# Patient Record
Sex: Male | Born: 1965 | Race: White | Hispanic: No | Marital: Married
Health system: Southern US, Community
[De-identification: ages and names within clinical notes are randomized; demographics above are authoritative.]

---

## 1999-07-17 ENCOUNTER — Encounter: Payer: Self-pay | Admitting: Oral Surgery

## 1999-07-17 ENCOUNTER — Encounter: Admission: RE | Admit: 1999-07-17 | Discharge: 1999-07-17 | Payer: Self-pay | Admitting: Oral Surgery

## 1999-07-20 ENCOUNTER — Ambulatory Visit (HOSPITAL_BASED_OUTPATIENT_CLINIC_OR_DEPARTMENT_OTHER): Admission: RE | Admit: 1999-07-20 | Discharge: 1999-07-20 | Payer: Self-pay

## 2005-09-29 ENCOUNTER — Emergency Department (HOSPITAL_COMMUNITY): Admission: EM | Admit: 2005-09-29 | Discharge: 2005-09-29 | Payer: Self-pay | Admitting: Emergency Medicine

## 2006-03-28 ENCOUNTER — Emergency Department (HOSPITAL_COMMUNITY): Admission: EM | Admit: 2006-03-28 | Discharge: 2006-03-29 | Payer: Self-pay | Admitting: Pediatrics

## 2007-12-20 ENCOUNTER — Observation Stay (HOSPITAL_COMMUNITY): Admission: EM | Admit: 2007-12-20 | Discharge: 2007-12-20 | Payer: Self-pay | Admitting: Emergency Medicine

## 2008-04-04 IMAGING — CT CT ABDOMEN W/O CM
2 of 4 series · 13 of 32 positions shown, 18 images · IV contrast (agent unspecified)
Comparison: none

CLINICAL DATA: Abdominal and pelvic pain with hematuria. Left back pain. 
 ABDOMEN CT WITHOUT CONTRAST:
TECHNIQUE: Multidetector CT imaging of the abdomen was performed following the standard protocol without IV contrast.   Comparison: There are no prior studies available for comparison.
TECHNIQUE: Multidetector CT imaging of the pelvis was performed following the standard protocol without IV contrast.

[Series 2: routine abdomen · axial · 0.82mm/px · z∈[-392,-92]mm · 5 of 89 slices shown, 10 images]
[im 15/89  soft-tissue]
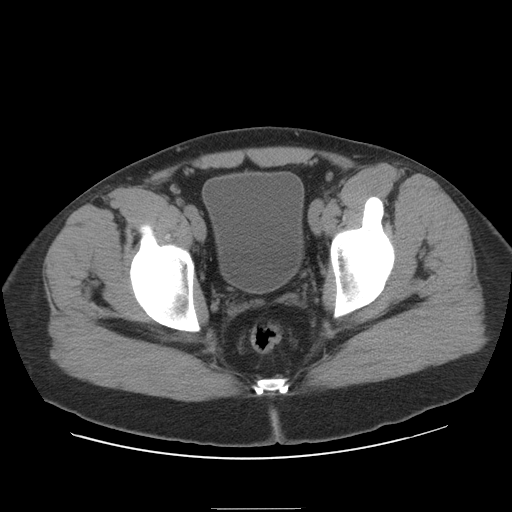
[im 15/89  bone]
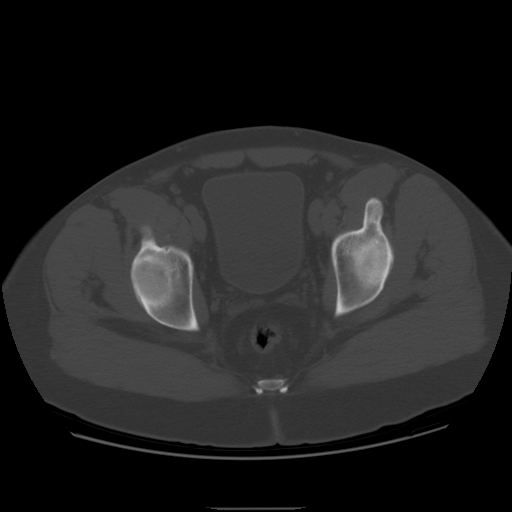
[im 30/89  soft-tissue]
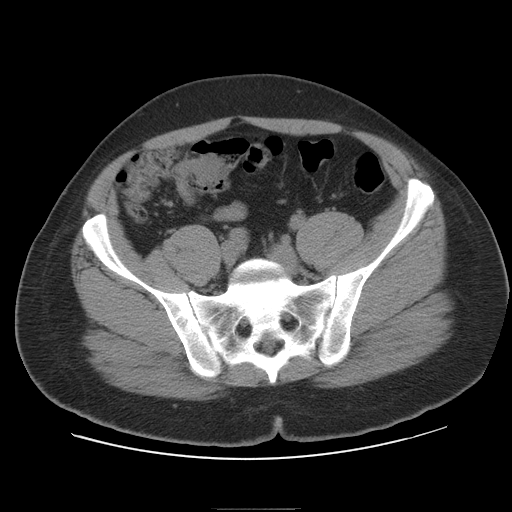
[im 30/89  lung]
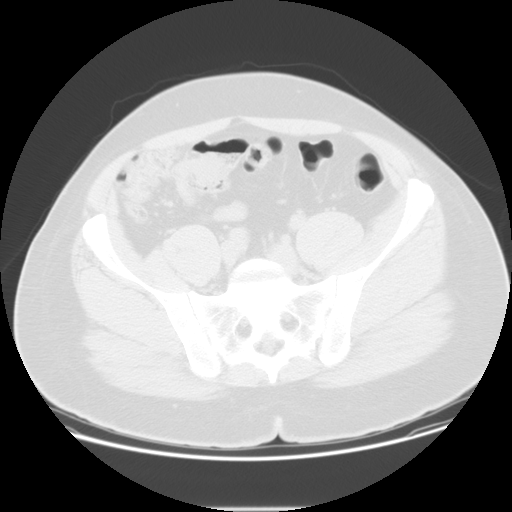
[im 45/89  soft-tissue]
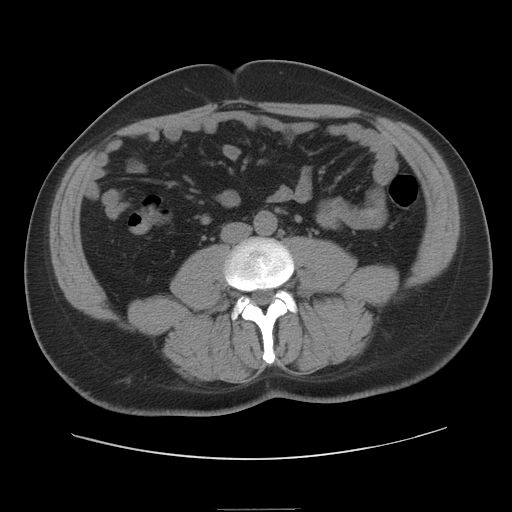
[im 45/89  lung]
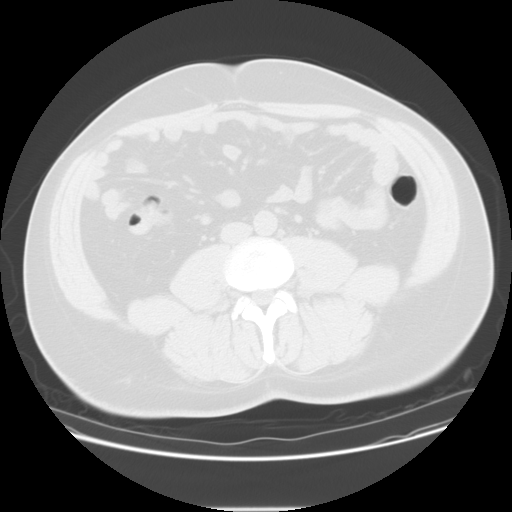
[im 59/89  soft-tissue]
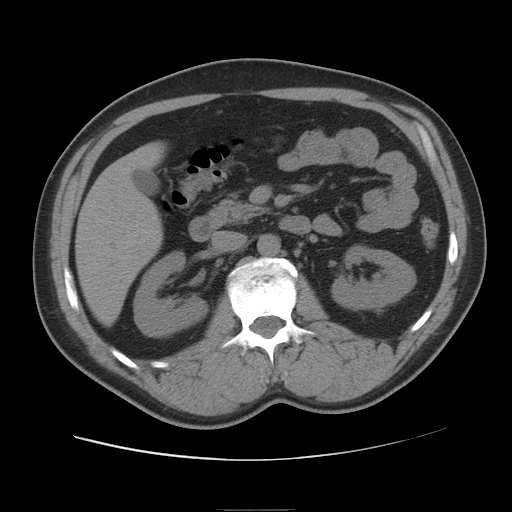
[im 59/89  lung]
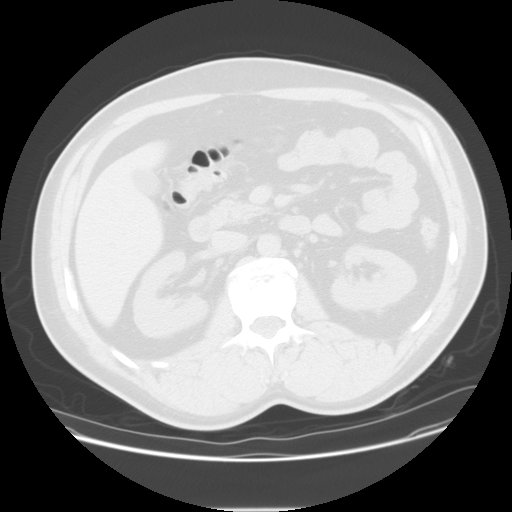
[im 74/89  soft-tissue]
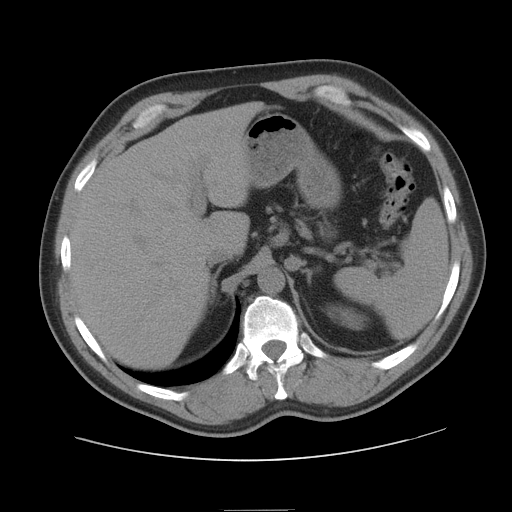
[im 74/89  lung]
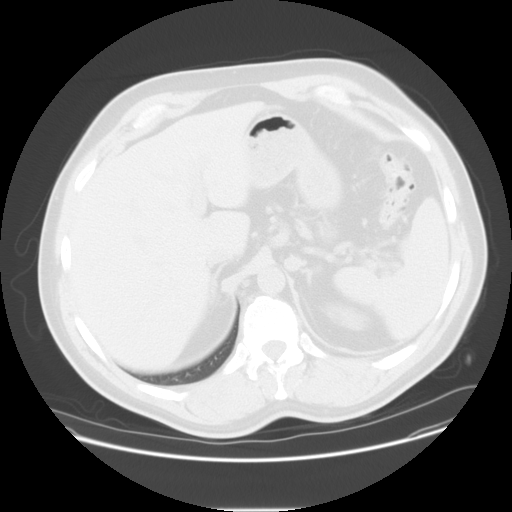

[Series 400: reformatted · sagittal · 0.97mm/px · 8 of 182 slices shown]
[im 17/182  soft-tissue]
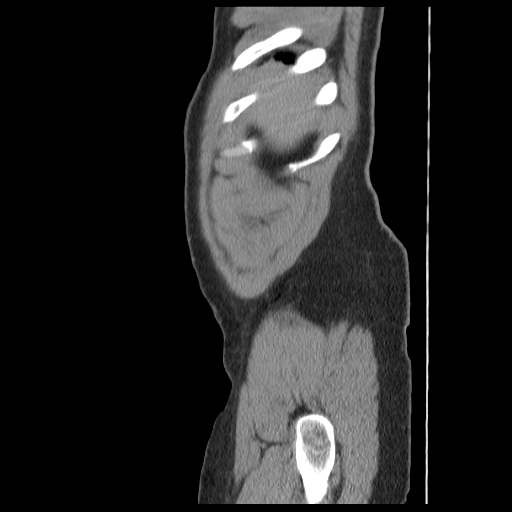
[im 33/182  soft-tissue]
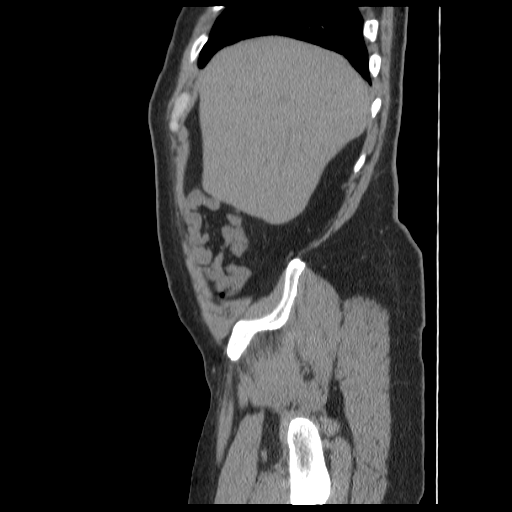
[im 66/182  soft-tissue]
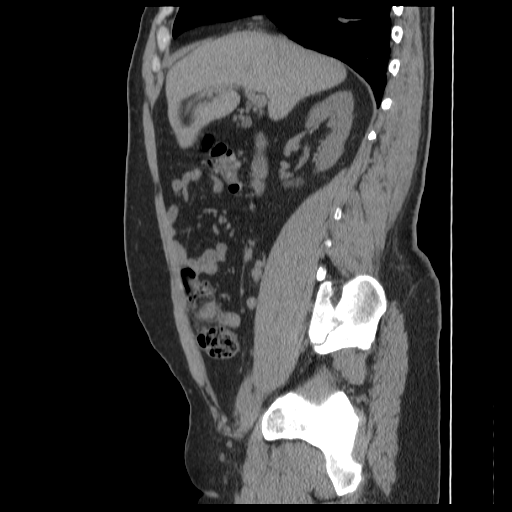
[im 83/182  soft-tissue]
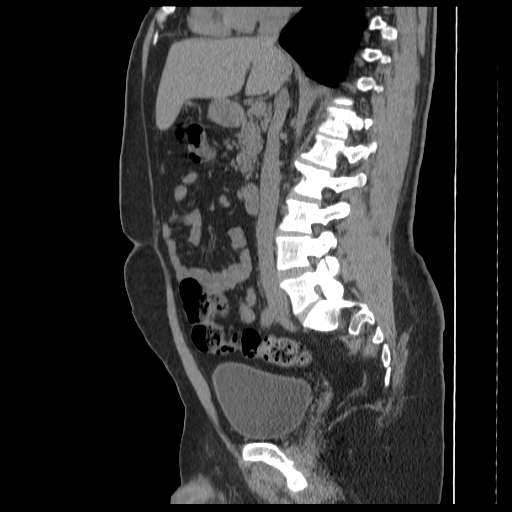
[im 99/182  soft-tissue]
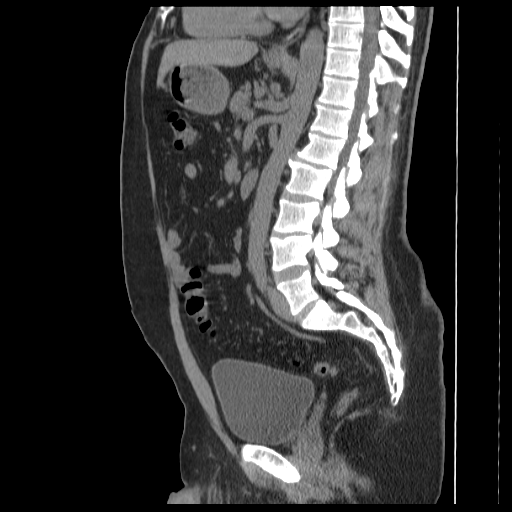
[im 116/182  soft-tissue]
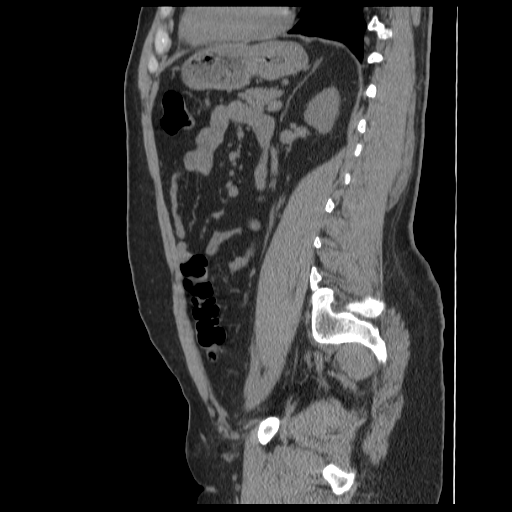
[im 149/182  soft-tissue]
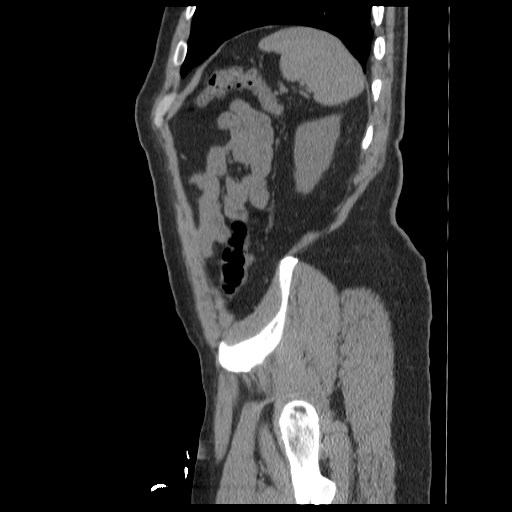
[im 165/182  soft-tissue]
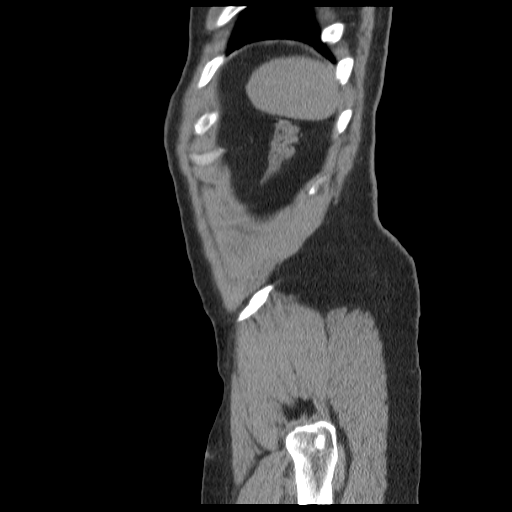

[13 of 32 positions shown; findings below may reference images not displayed]

FINDINGS: The liver, spleen, pancreas, gallbladder, adrenal glands, and kidneys are within normal limits.  No evidence of enlarged lymph nodes, free fluid, abdominal aortic aneurysm, or urinary calculi.  The visualized bowel is unremarkable. Please note that parenchymal abnormalities may be missed as intravenous contrast was not administered.
IMPRESSION: No acute abnormality.   
 PELVIS CT WITHOUT CONTRAST:
FINDINGS: The bladder and bowel are within normal limits. No evidence of free fluid or enlarged lymph nodes. Mild grade I retrolisthesis L1 on 2 and L2 on 3 noted with moderate degenerative disc disease and spondylosis, greatest at L1-2 and L3-4.
IMPRESSION: 1.  No acute abnormality. 
 2.  Degenerative changes within the lumbar spine.

## 2009-12-25 IMAGING — CR DG CHEST 2V
2 series · 2 of 2 positions shown · non-contrast
Comparison: None available.

CLINICAL DATA: Mid chest pain.

CHEST - 2 VIEW

[w chest pa]
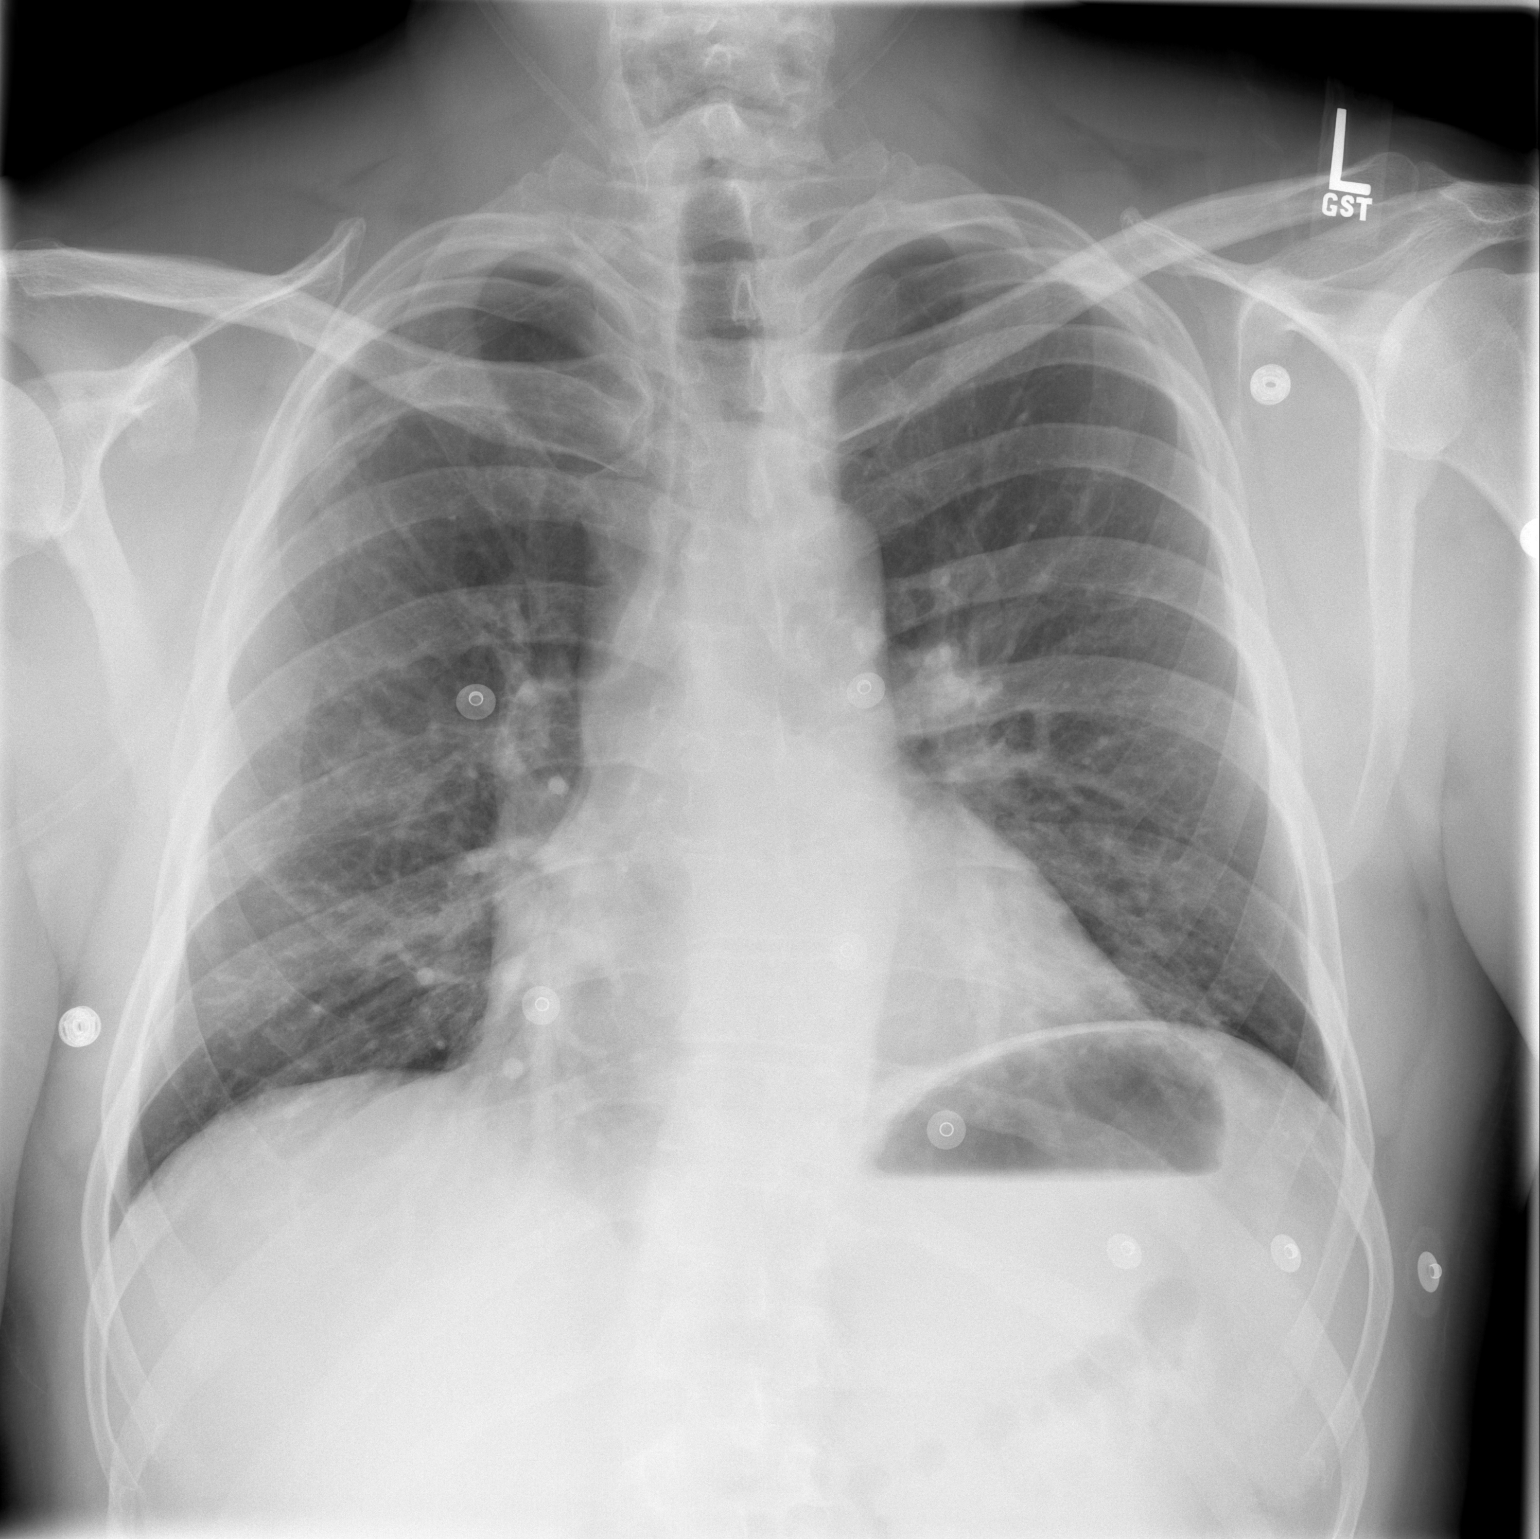

[w chest lat]
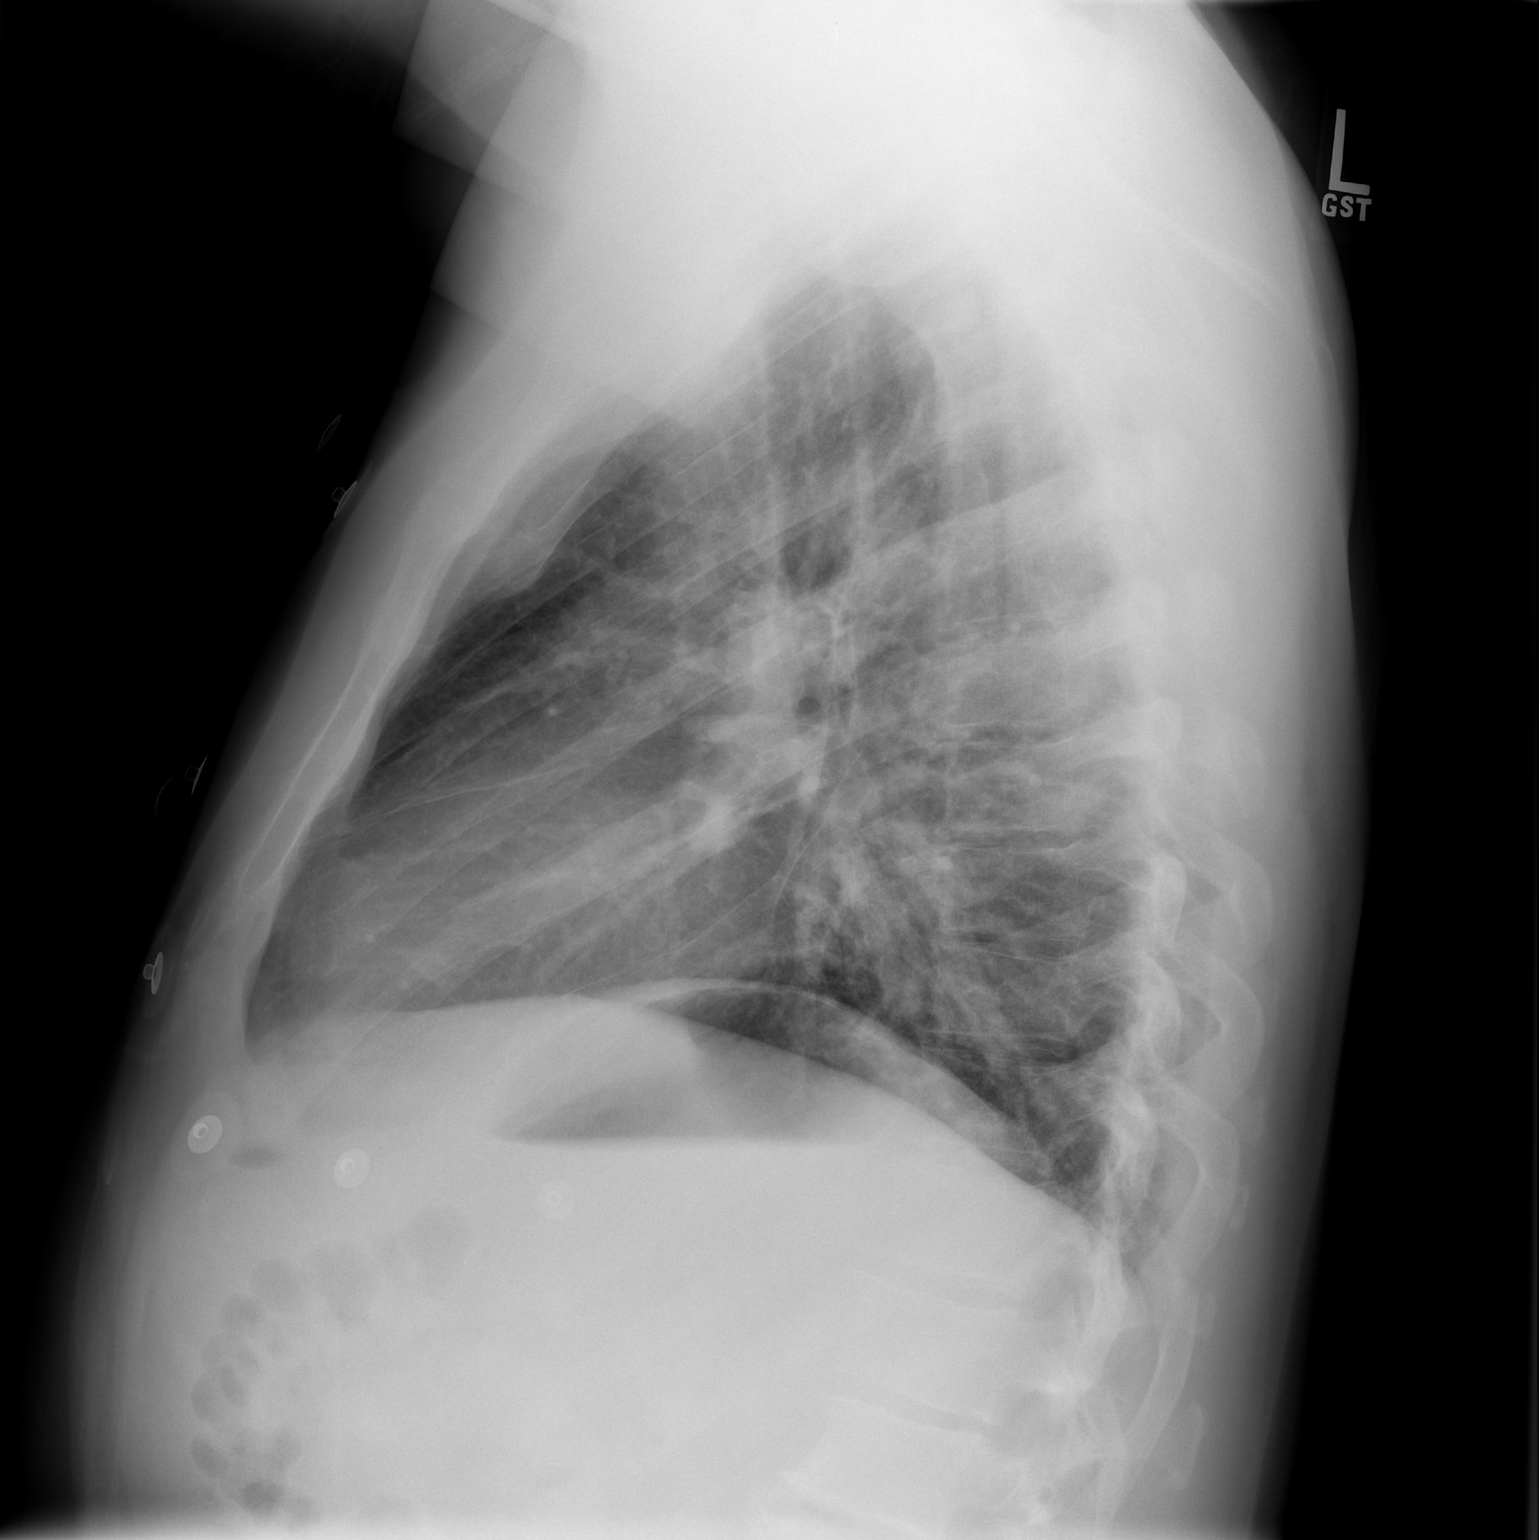

[2 of 2 positions shown; findings below may reference images not displayed]

FINDINGS: Cardiopericardial silhouette is at the upper limits of
normal for size.  Mild pulmonary vascular congestion is present.
There is no frank edema.  No focal airspace disease is seen lung
volumes are low.  The visualized soft tissues and bony thorax are
unremarkable.
IMPRESSION: 1.  Low lung volumes and mild pulmonary vascular congestion without
frank edema.

## 2010-07-14 NOTE — H&P (Signed)
Benjamin Pena, AUXIER NO.:  [id number]   MEDICAL RECORD NO.:  [id number]          PATIENT TYPE:  EMS   LOCATION:  [facility]                         FACILITY:  [facility]   PHYSICIAN:  Lamar Laundry, MD      DATE OF BIRTH:  August 28, 1965   DATE OF ADMISSION:  12/19/2007  DATE OF DISCHARGE:                              HISTORY & PHYSICAL   CHIEF COMPLAINT:  Chest pain.   HISTORY OF PRESENT ILLNESS:  This is a 45 year old gentleman with a  history of atrial fibrillation, hypertension, and hyperlipidemia, who  presents to the ER after having several episodes of chest pain at home  today.  He states that he first noted a sensation of chest pressure that  actually felt like heartburn across his chest at approximately 6:30 p.m.  He also states that he was nauseous.  He reports that he has not eaten  anything in several days because he is going through a separation with  his wife, and this has been quite stressful for him.  This pain lasted  about 30 minutes.  He was at rest the whole time that the pain was  occurring, and it was fairly mild, only rated 2 out of about 10 on a  pain scale.  The pain subsequently subsided, and he was feeling alright,  but then once again, he experienced a similar sensation of chest  pressure while he was lying down.  This time, it was a little bit  stronger at approximately 5/10 on the pain scale.  He did not have any  shortness of breath with these episodes.  He did not notice that the  episodes got worse when he walked from one room to another.  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  He does not have any chest pain or shortness of breath with  these activities.  He did not feel like his heart was racing during any  of these episodes.  He feels that his atrial fibrillation has been well  controlled on propafenone, which he is taking on a daily basis.   He denies any significant chest pain after eating  and denies any cold or  flu-type symptoms, not having any fevers, chills, or sick contacts.   ALLERGIES:  No known drug allergies.   MEDICATIONS:  1. Digoxin 0.25 mg p.o. daily.  2. Metoprolol 50 mg p.o. b.i.d.  3. Ramipril 2.5 mg p.o. t.i.d.  4. Diclofenac 75 mg p.o. b.i.d., although he is actually taking this      p.r.n. for pain.  5. Propafenone 225 mg p.o. t.i.d.  6. Multivitamin p.o. daily.   PAST MEDICAL HISTORY:  1. Atrial fibrillation, which was diagnosed approximately five years,      is paroxysmal and is currently being managed with propafenone.  2. Hypertension.  3. Hyperlipidemia.   PAST SURGICAL HISTORY:  No previous surgeries.   SOCIAL HISTORY:  He lives in [place].  He is currently in the process  of becoming separated from his wife.  He has a  79 year old daughter.  He  works as a Designer, fashion/clothing.  He has chewed tobacco for the last 30 years.  He has  not smoked any cigarettes.  He currently drinks about three beers per  week.  Formerly, he was a 3-beer-per-day drinker and did so for  approximately 15 years.  He denies any history of illicit drug use.   FAMILY HISTORY:  His father has had several myocardial infarctions, the  first one was at age 52.  His paternal aunt had an MI, either in her 50s  or her 39s.   REVIEW OF SYSTEMS:  A 10-point review of systems was performed and is  negative other than the complaints of chest pain and some nausea, as  previously noted in the history of present illness.   PHYSICAL EXAMINATION:  VITAL SIGNS:  Temperature 97.7, blood pressure  116/68, pulse 66, respirations 14, 100% on room air.  GENERAL:  This is a middle-aged gentleman in no acute distress.  HEENT:  Moist mucous membranes.  NECK:  No jugular venous distention.  CV:  Regular rate and rhythm.  S1 and S2.  No murmurs, rubs or gallops.  LUNGS:  Clear to auscultation bilaterally.  No crackles, wheezes, or  rhonchi.  ABDOMEN:  Soft, nontender, nondistended.  Normoactive  bowel sounds.  EXTREMITIES:  No pretibial edema.  SKIN:  No rashes.  LYMPH:  No palpable lymphadenopathy.   LABS:  CBC:  Hemoglobin is 15, hematocrit 44.  Basic metabolic panel  reveals a sodium of 133, potassium 3.5, chloride 97, BUN 7, creatinine  1.1.  Troponin less than 0.05.  CK-MB 1.  Myoglobin 32.3.  Digoxin level  0.3.   An EKG was performed in the ER, revealing the patient to be in normal  sinus rhythm with a ventricular rate of 66 beats per minute.  There are  no concerning ST-T wave changes.  Normal EKG.   Chest x-ray was also performed in the emergency department, revealing  simply mild pulmonary vascular congestion.   ASSESSMENT/PLAN:  This is a 45 year old gentleman with a history of  atrial fibrillation, hypertension, hyperlipidemia, who presents to the  ER with chest discomfort.  1. Chest discomfort:  Overall, this is likely psychogenic, as this      patient has not been eating and has been under a lot of stress with      the separation from his wife.  However, due to his multiple cardiac      risk factors, we will rule out an acute myocardial infarction with      serial troponins, telemetry, and serial EKGs.  Of note, the patient      did have, per report, a negative nuclear stress test in March of      this year, conducted at [facility].  He also reports that      he had a cath several years ago which was negative.  We will      attempt to obtain these records.  We will continue the patient on      his outpatient metoprolol, place him on aspirin 325 mg p.o. daily,      not only for his chest pain but also for his atrial fibrillation.      Give him p.r.n. sublingual nitroglycerin as needed.  We will also      provide an oral proton pump inhibitor for possible gastroesophageal      reflux disease.  2. Atrial fibrillation:  Presently, the patient is in normal sinus  rhythm.  We will continue his propafenone and his      digoxin and will place him on  aspirin 325 mg p.o. daily.  3. Hypertension:  We will continue this patient's home medications,      including metoprolol and Ramipril.  4. Prophylaxis:  Place this patient on subcu heparin.      Lamar Laundry, MD  Electronically Signed     HR/MEDQ  D:  12/20/2007  T:  12/20/2007  Job:  [id number]

## 2010-07-17 NOTE — Op Note (Signed)
[place]  Patient:    Benjamin Pena, Benjamin Pena                         MRN: [id number] Proc. Date: 07/20/99 Adm. Date:  [id number] Disc. Date: [id number] Attending:  Retia Passe                           Operative Report  PREOPERATIVE DIAGNOSES:   Maxillary-mandibular deeply decayed and periodontally involved teeth #2, 3, 4, 5, 6, 7, 8, 9, 10, 11, 12, 13, 14, 15, 16, 17, 18, 20, 21, 22, 23, 24, 25, 26, 27, 28, 29, 30 31, 32, right and left mandibular lingual tori.  POSTOPERATIVE DIAGNOSES:   Maxillary-mandibular deeply decayed and periodontally involved teeth #2, 3, 4, 5, 6, 7, 8, 9, 10, 11, 12, 13, 14, 15, 16, 17, 18, 20, 21, 22, 23, 24, 25, 26, 27, 28, 29, 30 31, 32, right and left mandibular lingual tori.  PROCEDURE:  Total odontectomy and reduction of right and left mandibular lingual tori, maxillary, mandibular alveoloplasties.  SURGEON:  Vania Rea. Warren Danes, D.D.S.  FIRST ASSISTANT:  Fletcher.  ANESTHESIA:  General via nasoendotracheal intubation.  ESTIMATED BLOOD LOSS:  Approximately 150 cc.  FLUID REPLACEMENT:  Approximately 1500 cc crystalloid solution.  COMPLICATIONS:  None apparent.  INDICATION FOR PROCEDURE:  Mr. Benjamin Pena is a pleasant 45 year old gentleman, who was referred to my office for removal of his remaining dentition.  The patient has suffered from chronic periodontal disease and dental decay.  The patient is not interested in restoring any of the teeth and desired their removal so that a complete maxillary mandibular denture could be constructed.  The patient presented with bilateral mandibular lingual tori that would be in the way of dental prostheses, and these were scheduled to be removed as well.  Due to the number of teeth and the tori, the patient was scheduled as a general anesthesia in an outpatient setting.  DESCRIPTION OF PROCEDURE:  On Jul 20, 1999, the patient was taken to [facility],  where he was placed on the operating room table in a supine position.  Following successful nasoendotracheal intubation and general anesthesia, the patients face, neck, and oral cavity were prepped and draped in the usual sterile operating room fashion.  The hypopharynx was suctioned free of fluids and secretions, and a moistened two-inch vaginal pack was placed as a throat pack.  Attention was then directed intraorally, where approximately 18 cc of 0.5% Xylocaine containing 1:200,000 epinephrine was infiltrated in the maxillary buccal and palatal soft tissues.  A quantity of the same solution was deposited in the right and left inferior alveolar neurovascular region. Attention was then directed intraorally, where a #9 Molt periosteal elevator was used to reflect a full-thickness mucoperiosteal flap around the buccal and lingual surfaces of the maxillary dentition.  The teeth were subluxated from the alveolus using the 11A elevator and then removed from the alveolus using a 150 dental forceps.  In a similar fashion, the mandibular dentition was removed as well.  The lingual soft tissue over the tori was reflected using a #9 Molt periosteal elevator, exposing the tori.  The tori were _____ with a Stryker rotary osteotome and a 107 Fisher bur.  The tori were then removed from the lingual portion of the mandible with a small straight osteotome and a fiberglass-tipped mallet.  Final contouring of the tori and their reduction  was accomplished using the Stryker rotary osteotome and an alveolar bur.  Attention was then directed toward the maxillary-mandibular alveolar processes, where the Stryker rotary osteotome with the alveolar bur was used to smooth and contour the alveolar ridge in preparation for complete maxillary dentures.  The areas were then thoroughly irrigated with sterile saline irrigation and suctioned.  The mucoperiosteal margins were then approximated and sutured in a continuous  interlocking fashion using 4-0 chromic suture material on an FS2 needle.  The oral cavity was then thoroughly irrigated and suctioned.  The throat pack was removed and the hypopharynx suctioned free of fluids and secretions.  Packs of 4 x 4 gauze were then placed to help with hemostasis.  The patient was allowed to awaken from the anesthesia and taken to the recovery room, where he tolerated the procedure well without apparent complications. DD:  07/20/99 TD:  07/25/99 Job: [id number]

## 2010-11-30 LAB — DIFFERENTIAL
Basophils Relative: 0
Lymphocytes Relative: 15
Lymphs Abs: 1.5
Monocytes Relative: 9
Neutro Abs: 7.8 — ABNORMAL HIGH
Neutrophils Relative %: 76

## 2010-11-30 LAB — CARDIAC PANEL(CRET KIN+CKTOT+MB+TROPI)
CK, MB: 1.8
Total CK: 279 — ABNORMAL HIGH

## 2010-11-30 LAB — DIGOXIN LEVEL: Digoxin Level: 0.3 — ABNORMAL LOW

## 2010-11-30 LAB — CK TOTAL AND CKMB (NOT AT ARMC): Total CK: 183

## 2010-11-30 LAB — BASIC METABOLIC PANEL
BUN: 8
Creatinine, Ser: 0.81
GFR calc Af Amer: >60
GFR calc non Af Amer: >60

## 2010-11-30 LAB — POCT I-STAT, CHEM 8
Chloride: 96
Creatinine, Ser: 1.1
HCT: 44
Hemoglobin: 15
Potassium: 3.5
Sodium: 133 — ABNORMAL LOW

## 2010-11-30 LAB — TROPONIN I: Troponin I: <0.01

## 2010-11-30 LAB — LIPID PANEL
Total CHOL/HDL Ratio: 6.6
VLDL: 46 — ABNORMAL HIGH

## 2010-11-30 LAB — POCT CARDIAC MARKERS
CKMB, poc: 1
Troponin i, poc: <0.05

## 2010-11-30 LAB — CBC
RBC: 4.4
WBC: 10.2

## 2011-12-06 DIAGNOSIS — I480 Paroxysmal atrial fibrillation: Secondary | ICD-10-CM | POA: Insufficient documentation

## 2014-12-29 DIAGNOSIS — I10 Essential (primary) hypertension: Secondary | ICD-10-CM | POA: Insufficient documentation

## 2017-06-04 DIAGNOSIS — I200 Unstable angina: Secondary | ICD-10-CM | POA: Insufficient documentation

## 2017-06-04 DIAGNOSIS — F1010 Alcohol abuse, uncomplicated: Secondary | ICD-10-CM | POA: Insufficient documentation

## 2017-12-06 DIAGNOSIS — E782 Mixed hyperlipidemia: Secondary | ICD-10-CM | POA: Insufficient documentation

## 2018-04-02 DIAGNOSIS — K219 Gastro-esophageal reflux disease without esophagitis: Secondary | ICD-10-CM | POA: Insufficient documentation

## 2018-04-10 DIAGNOSIS — Z2821 Immunization not carried out because of patient refusal: Secondary | ICD-10-CM | POA: Insufficient documentation

## 2018-10-03 DIAGNOSIS — F1020 Alcohol dependence, uncomplicated: Secondary | ICD-10-CM | POA: Insufficient documentation

## 2018-12-13 DIAGNOSIS — F411 Generalized anxiety disorder: Secondary | ICD-10-CM | POA: Insufficient documentation

## 2018-12-13 DIAGNOSIS — G4709 Other insomnia: Secondary | ICD-10-CM | POA: Insufficient documentation

## 2018-12-13 DIAGNOSIS — Z0000 Encounter for general adult medical examination without abnormal findings: Secondary | ICD-10-CM | POA: Insufficient documentation

## 2018-12-13 DIAGNOSIS — M25461 Effusion, right knee: Secondary | ICD-10-CM | POA: Insufficient documentation

## 2019-04-17 DIAGNOSIS — Z2882 Immunization not carried out because of caregiver refusal: Secondary | ICD-10-CM | POA: Insufficient documentation

## 2020-01-08 DIAGNOSIS — G4733 Obstructive sleep apnea (adult) (pediatric): Secondary | ICD-10-CM | POA: Insufficient documentation

## 2021-02-11 DIAGNOSIS — Z23 Encounter for immunization: Secondary | ICD-10-CM | POA: Insufficient documentation

## 2021-07-21 ENCOUNTER — Other Ambulatory Visit: Payer: Self-pay

## 2021-07-21 DIAGNOSIS — Z720 Tobacco use: Secondary | ICD-10-CM | POA: Insufficient documentation

## 2021-07-22 ENCOUNTER — Ambulatory Visit: Payer: No Typology Code available for payment source | Admitting: Podiatry

## 2021-07-22 DIAGNOSIS — L603 Nail dystrophy: Secondary | ICD-10-CM

## 2021-07-22 NOTE — Progress Notes (Signed)
  Subjective:  Patient ID: Benjamin Pena, male    DOB: 1965/07/11,  MRN: [id number]  Chief Complaint  Patient presents with   Nail Problem    56 y.o. male presents with the above complaint.  Patient presents with complaint of bilateral onychodystrophy.  Patient states the nail is discolored is thicker and is causing some pain the entire nail hurts.  Hurts with pressure.  He mostly wear steel toe boots and works on his feet all day long.  He would like to discuss treatment options for this.  He denies seeing anyone else prior to seeing me.  He denies any other acute complaints.   Review of Systems: Negative except as noted in the HPI. Denies N/V/F/Ch.  No past medical history on file.  Current Outpatient Medications:    allopurinol (ZYLOPRIM) 100 MG tablet, Take by mouth., Disp: , Rfl:    aspirin 81 MG chewable tablet, Chew by mouth., Disp: , Rfl:    atorvastatin (LIPITOR) 20 MG tablet, Take by mouth., Disp: , Rfl:    metoprolol tartrate (LOPRESSOR) 50 MG tablet, TAKE ONE TABLET BY MOUTH 2 TIMES DAILY., Disp: , Rfl:    Multiple Vitamin (MULTI-VITAMIN) tablet, Take 1 tablet by mouth daily., Disp: , Rfl:    Multiple Vitamins-Minerals (THERA-M) TABS, Take 1 tablet by mouth daily., Disp: , Rfl:    omeprazole (PRILOSEC) 40 MG capsule, Take 1 capsule by mouth daily., Disp: , Rfl:    predniSONE (DELTASONE) 20 MG tablet, Take by mouth., Disp: , Rfl:    propafenone (RYTHMOL SR) 225 MG 12 hr capsule, Take by mouth., Disp: , Rfl:    ramipril (ALTACE) 2.5 MG capsule, Take by mouth., Disp: , Rfl:    traZODone (DESYREL) 50 MG tablet, TAKE 1 TABLET BY MOUTH EVERYDAY AT BEDTIME, Disp: , Rfl:   Social History   Tobacco Use  Smoking Status Not on file  Smokeless Tobacco Not on file    Not on File Objective:  There were no vitals filed for this visit. There is no height or weight on file to calculate BMI. Constitutional Well developed. Well nourished.  Vascular Dorsalis pedis pulses palpable  bilaterally. Posterior tibial pulses palpable bilaterally. Capillary refill normal to all digits.  No cyanosis or clubbing noted. Pedal hair growth normal.  Neurologic Normal speech. Oriented to person, place, and time. Epicritic sensation to light touch grossly present bilaterally.  Dermatologic Nails thickened elongated dystrophic mycotic toenails x2.  Pain on palpation to the entire nail.  Ingrowing noted as well. Skin within normal limits  Orthopedic: Normal joint ROM without pain or crepitus bilaterally. No visible deformities. No bony tenderness.   Radiographs: None Assessment:   1. Onychodystrophy    Plan:  Patient was evaluated and treated and all questions answered.  Bilateral hallux onychomycosis/nail dystrophy -All questions and concerns were discussed with the patient in extensive detail.  Given the amount of dystrophic nature to the nail he will benefit from total nail avulsion under bilateral nail.  For now he would like to think about it.  He will also make the decision whether he wants to make it permanent. -He will come back to see me when he is ready  No follow-ups on file.

## 2022-12-02 DIAGNOSIS — F1010 Alcohol abuse, uncomplicated: Secondary | ICD-10-CM | POA: Diagnosis not present

## 2022-12-02 DIAGNOSIS — I480 Paroxysmal atrial fibrillation: Secondary | ICD-10-CM | POA: Diagnosis not present

## 2022-12-02 DIAGNOSIS — G4709 Other insomnia: Secondary | ICD-10-CM | POA: Diagnosis not present

## 2022-12-02 DIAGNOSIS — E782 Mixed hyperlipidemia: Secondary | ICD-10-CM | POA: Diagnosis not present

## 2022-12-02 DIAGNOSIS — Z720 Tobacco use: Secondary | ICD-10-CM | POA: Diagnosis not present

## 2022-12-02 DIAGNOSIS — Z6839 Body mass index (BMI) 39.0-39.9, adult: Secondary | ICD-10-CM | POA: Diagnosis not present

## 2022-12-02 DIAGNOSIS — I10 Essential (primary) hypertension: Secondary | ICD-10-CM | POA: Diagnosis not present

## 2022-12-02 DIAGNOSIS — G4733 Obstructive sleep apnea (adult) (pediatric): Secondary | ICD-10-CM | POA: Diagnosis not present

## 2022-12-02 DIAGNOSIS — E66812 Obesity, class 2: Secondary | ICD-10-CM | POA: Diagnosis not present
